# Patient Record
Sex: Female | Born: 1985 | Race: Black or African American | Hispanic: No | Smoking: Current every day smoker
Health system: Southern US, Community
[De-identification: ages and names within clinical notes are randomized; demographics above are authoritative.]

## PROBLEM LIST (undated history)

## (undated) HISTORY — PX: CHOLECYSTECTOMY: SHX55

---

## 2011-09-05 ENCOUNTER — Emergency Department: Payer: Self-pay | Admitting: Emergency Medicine

## 2011-09-05 LAB — URINALYSIS, COMPLETE
Bilirubin,UR: NEGATIVE
Glucose,UR: NEGATIVE mg/dL (ref 0–75)
Leukocyte Esterase: NEGATIVE
RBC,UR: 5 /HPF (ref 0–5)
Squamous Epithelial: 3

## 2011-09-05 LAB — COMPREHENSIVE METABOLIC PANEL
Albumin: 3.7 g/dL (ref 3.4–5.0)
Alkaline Phosphatase: 75 U/L (ref 50–136)
Anion Gap: 11 (ref 7–16)
Calcium, Total: 9.3 mg/dL (ref 8.5–10.1)
Chloride: 102 mmol/L (ref 98–107)
Co2: 25 mmol/L (ref 21–32)
Creatinine: 0.71 mg/dL (ref 0.60–1.30)
EGFR (Non-African Amer.): >60
Glucose: 104 mg/dL — ABNORMAL HIGH (ref 65–99)
Osmolality: 276 (ref 275–301)
Sodium: 138 mmol/L (ref 136–145)
Total Protein: 8.8 g/dL — ABNORMAL HIGH (ref 6.4–8.2)

## 2011-09-05 LAB — CBC
HCT: 37.8 % (ref 35.0–47.0)
HGB: 12.1 g/dL (ref 12.0–16.0)
MCHC: 32.1 g/dL (ref 32.0–36.0)
RDW: 16 % — ABNORMAL HIGH (ref 11.5–14.5)
WBC: 11.8 10*3/uL — ABNORMAL HIGH (ref 3.6–11.0)

## 2017-08-03 ENCOUNTER — Ambulatory Visit
Admission: EM | Admit: 2017-08-03 | Discharge: 2017-08-03 | Disposition: A | Discharge status: Home / Self Care | Payer: Medicaid Other | Attending: Family Medicine | Admitting: Family Medicine

## 2017-08-03 ENCOUNTER — Other Ambulatory Visit: Payer: Self-pay

## 2017-08-03 DIAGNOSIS — K0889 Other specified disorders of teeth and supporting structures: Secondary | ICD-10-CM

## 2017-08-03 DIAGNOSIS — M791 Myalgia, unspecified site: Secondary | ICD-10-CM | POA: Diagnosis not present

## 2017-08-03 DIAGNOSIS — J111 Influenza due to unidentified influenza virus with other respiratory manifestations: Secondary | ICD-10-CM

## 2017-08-03 DIAGNOSIS — J9801 Acute bronchospasm: Secondary | ICD-10-CM | POA: Diagnosis not present

## 2017-08-03 DIAGNOSIS — R0981 Nasal congestion: Secondary | ICD-10-CM

## 2017-08-03 DIAGNOSIS — R05 Cough: Secondary | ICD-10-CM | POA: Diagnosis not present

## 2017-08-03 DIAGNOSIS — R69 Illness, unspecified: Secondary | ICD-10-CM

## 2017-08-03 MED ORDER — BENZONATATE 100 MG PO CAPS: 100.0000 mg | ORAL_CAPSULE | Freq: Three times a day (TID) | ORAL | 0 refills | Status: DC | PRN

## 2017-08-03 MED ORDER — OSELTAMIVIR PHOSPHATE 75 MG PO CAPS: 75.0000 mg | ORAL_CAPSULE | Freq: Two times a day (BID) | ORAL | 0 refills | Status: AC

## 2017-08-03 MED ORDER — CLINDAMYCIN HCL 300 MG PO CAPS: 300.0000 mg | ORAL_CAPSULE | Freq: Three times a day (TID) | ORAL | 0 refills | Status: AC

## 2017-08-03 MED ORDER — PREDNISONE 10 MG PO TABS: ORAL_TABLET | ORAL | 0 refills | Status: AC

## 2017-08-03 MED ORDER — HYDROCOD POLST-CPM POLST ER 10-8 MG/5ML PO SUER: 5.0000 mL | Freq: Every evening | ORAL | 0 refills | Status: AC | PRN

## 2017-08-03 MED ORDER — ALBUTEROL SULFATE HFA 108 (90 BASE) MCG/ACT IN AERS: 2.0000 | INHALATION_SPRAY | RESPIRATORY_TRACT | 0 refills | Status: AC | PRN

## 2017-08-03 NOTE — ED Triage Notes (Addendum)
Patient complains of cough, congestion, fever, body aches x 2 days.   Patient also complains of dental pain x 1 week ago.

## 2017-08-03 NOTE — ED Provider Notes (Signed)
[facility] ____________________________________________  Time seen: Approximately 9:22 AM  I have reviewed the triage vital signs and the nursing notes.   HISTORY  Chief Complaint Cough and Dental Pain   HPI Angelica Watkins is a 32 y.o. female presenting for evaluation of 2 complaints, reporting cough congestion symptoms as well as dental pain.  Patient reports for the last 2 days she has been having runny nose, nasal congestion, scratchy throat, cough, chills, body aches and fever.  Reports fever 101 yesterday at home.  Did take BC powder today prior to arrival.  States she has been around multiple sick contacts, some that she believes has had the flu.  Also took some DayQuil yesterday without much change in symptoms.  States cough tends to come in coughing fits.  Reports overall continues to drink fluids well, slight decrease in appetite.  States scratchy throat is very mild and does not feel like previous strep.  Continues to remain active.  Denies other aggravating or alleviating factors.  Also reports that she has had left upper dental pain for just over 1 week.  Patient states she has a fractured tooth to that area from a cavity, and states that the pain has gradually been present and has been constant.  States she saw a dentist for cleaning, but has not yet scheduled appointment to have a cavity fix.  States will plan to follow-up with dentist.  Denies chest pain, shortness of breath, abdominal pain, dysuria, extremity pain, extremity swelling or rash. Denies recent sickness. Denies recent antibiotic use.  Denies history of recent immobilization.  Denies asthma history.  Patient's last menstrual period was 08/01/2017.Denies pregnancy.   History reviewed. No pertinent past medical history.  There are no active problems to display for this patient.   Past Surgical History:  Procedure Laterality Date  . CHOLECYSTECTOMY       No current facility-administered  medications for this encounter.   Current Outpatient Medications:  .  albuterol (PROVENTIL HFA;VENTOLIN HFA) 108 (90 Base) MCG/ACT inhaler, Inhale 2 puffs into the lungs every 4 (four) hours as needed for wheezing or shortness of breath., Disp: 1 Inhaler, Rfl: 0 .  benzonatate (TESSALON PERLES) 100 MG capsule, Take 1 capsule (100 mg total) by mouth 3 (three) times daily as needed for cough., Disp: 15 capsule, Rfl: 0 .  chlorpheniramine-HYDROcodone (TUSSIONEX PENNKINETIC ER) 10-8 MG/5ML SUER, Take 5 mLs by mouth at bedtime as needed for cough. do not drive or operate machinery while taking as can cause drowsiness., Disp: 75 mL, Rfl: 0 .  clindamycin (CLEOCIN) 300 MG capsule, Take 1 capsule (300 mg total) by mouth 3 (three) times daily for 7 days., Disp: 21 capsule, Rfl: 0 .  oseltamivir (TAMIFLU) 75 MG capsule, Take 1 capsule (75 mg total) by mouth every 12 (twelve) hours., Disp: 10 capsule, Rfl: 0 .  predniSONE (DELTASONE) 10 MG tablet, Start 60 mg po day one, then 50 mg po day two, taper by 10 mg daily until complete., Disp: 21 tablet, Rfl: 0  Allergies Penicillins  Family History  Problem Relation Age of Onset  . Thyroid disease Mother   . Hypertension Mother   . Hypertension Father   . Diabetes Father   . Kidney failure Father     Social History Social History   Tobacco Use  . Smoking status: Current Every Day Smoker    Packs/day: 0.20  . Smokeless tobacco: Never Used  Substance Use Topics  . Alcohol use: No    Frequency: Never  .  Drug use: No    Review of Systems Constitutional: As above.  ENT: As above.  Cardiovascular: Denies chest pain. Respiratory: Denies shortness of breath. Gastrointestinal: No abdominal pain.  No nausea, no vomiting.  No diarrhea.  Genitourinary: Negative for dysuria. Musculoskeletal: Negative for back pain. Skin: Negative for rash.   ____________________________________________   PHYSICAL EXAM:  VITAL SIGNS: ED Triage Vitals  Enc  Vitals Group     BP 08/03/17 0859 116/70     Pulse Rate 08/03/17 0859 (!) 110     Resp 08/03/17 0859 18     Temp 08/03/17 0859 100.3 F (37.9 C)     Temp Source 08/03/17 0859 Oral     SpO2 08/03/17 0859 95 %     Weight 08/03/17 0855 280 lb (127 kg)     Height 08/03/17 0855 5\' 5"  (1.651 m)     Head Circumference --      Peak Flow --      Pain Score 08/03/17 0855 8     Pain Loc --      Pain Edu? --      Excl. in GC? --     Constitutional: Alert and oriented. Well appearing and in no acute distress. Eyes: Conjunctivae are normal.  Head: Atraumatic. No sinus tenderness to palpation. No swelling. No erythema.  Ears: no erythema, normal TMs bilaterally.   Nose:Nasal congestion with clear rhinorrhea  Mouth/Throat: Mucous membranes are moist. No pharyngeal erythema. No tonsillar swelling or exudate.  Left upper last molar, lateral aspect cavity and fracture present with mild surrounding gum tenderness, erythema and inflammation, no visualized or palpated abscess. Neck: No stridor.  No cervical spine tenderness to palpation. Hematological/Lymphatic/Immunilogical: No cervical lymphadenopathy. Cardiovascular: Normal rate, regular rhythm. Grossly normal heart sounds.  Good peripheral circulation. Respiratory: Normal respiratory effort.  No retractions. No wheezes, rales or rhonchi. Good air movement.  Dry intermittent cough noted in room with mild bronchospasm.  Speaks in complete sentences. Gastrointestinal: Soft and nontender. Obese abdomen.  Musculoskeletal: Ambulatory with steady gait.  Neurologic:  Normal speech and language. No gait instability. Skin:  Skin appears warm, dry and intact. No rash noted. Psychiatric: Mood and affect are normal. Speech and behavior are normal.  ___________________________________________   LABS (all labs ordered are listed, but only abnormal results are displayed)  Labs Reviewed - No data to display  RADIOLOGY  No results  found. ____________________________________________   PROCEDURES Procedures  _________________________________________   INITIAL IMPRESSION / ASSESSMENT AND PLAN / ED COURSE  Pertinent labs & imaging results that were available during my care of the patient were reviewed by me and considered in my medical decision making (see chart for details).  Well-appearing patient.  No acute distress.  Patient has dental pain with erythema at cavity and fracture site, concern for secondary infection.  Patient penicillin allergic, will place patient on oral clindamycin.  Encourage over-the-counter probiotics and supportive care.  Patient also with influenza-like illness.  Patient lungs clear throughout, however noted bronchospasm with cough.  Symptoms present for 2 days and lungs clear throughout, deferred x-ray, patient agreed.  Will place patient on prednisone taper, albuterol inhaler, Tussionex, Tessalon and Tamiflu.  Discussed very strict follow-up and return parameters.  Encourage rest, fluids, supportive care.  Also encourage patient to follow back up with her dentist.Discussed indication, risks and benefits of medications with patient.  Discussed follow up with Primary care physician this week. Discussed follow up and return parameters including no resolution or any worsening concerns. Patient verbalized  understanding and agreed to plan.   ____________________________________________   FINAL CLINICAL IMPRESSION(S) / ED DIAGNOSES  Final diagnoses:  Influenza-like illness  Bronchospasm  Pain, dental     ED Discharge Orders        Ordered    predniSONE (DELTASONE) 10 MG tablet     08/03/17 0937    benzonatate (TESSALON PERLES) 100 MG capsule  3 times daily PRN     08/03/17 0937    chlorpheniramine-HYDROcodone (TUSSIONEX PENNKINETIC ER) 10-8 MG/5ML SUER  At bedtime PRN     08/03/17 0937    albuterol (PROVENTIL HFA;VENTOLIN HFA) 108 (90 Base) MCG/ACT inhaler  Every 4 hours PRN      08/03/17 0937    oseltamivir (TAMIFLU) 75 MG capsule  Every 12 hours     08/03/17 0937    clindamycin (CLEOCIN) 300 MG capsule  3 times daily     08/03/17 [id number]       Note: This dictation was prepared with Dragon dictation along with smaller phrase technology. Any transcriptional errors that result from this process are unintentional.         Renford DillsMiller, Merle Whitehorn, NP 08/03/17 [phone]

## 2017-08-03 NOTE — Discharge Instructions (Signed)
Take medication as prescribed. Rest. Drink plenty of fluids.   Follow up with Dentist as soon as possible.  Follow up with your primary care physician this week as needed. Return to Urgent care for new or worsening concerns.

## 2017-08-06 ENCOUNTER — Emergency Department: Payer: Medicaid Other

## 2017-08-06 ENCOUNTER — Telehealth: Payer: Self-pay

## 2017-08-06 ENCOUNTER — Other Ambulatory Visit: Payer: Self-pay

## 2017-08-06 ENCOUNTER — Emergency Department
Admission: EM | Admit: 2017-08-06 | Discharge: 2017-08-06 | Disposition: A | Discharge status: Home / Self Care | Payer: Medicaid Other | Attending: Emergency Medicine | Admitting: Emergency Medicine

## 2017-08-06 ENCOUNTER — Encounter: Payer: Self-pay | Admitting: Emergency Medicine

## 2017-08-06 DIAGNOSIS — Z79899 Other long term (current) drug therapy: Secondary | ICD-10-CM | POA: Insufficient documentation

## 2017-08-06 DIAGNOSIS — F17210 Nicotine dependence, cigarettes, uncomplicated: Secondary | ICD-10-CM | POA: Diagnosis not present

## 2017-08-06 DIAGNOSIS — J111 Influenza due to unidentified influenza virus with other respiratory manifestations: Secondary | ICD-10-CM | POA: Diagnosis not present

## 2017-08-06 DIAGNOSIS — R05 Cough: Secondary | ICD-10-CM | POA: Diagnosis present

## 2017-08-06 DIAGNOSIS — R69 Illness, unspecified: Secondary | ICD-10-CM

## 2017-08-06 MED ORDER — BENZONATATE 100 MG PO CAPS: 100.0000 mg | ORAL_CAPSULE | Freq: Three times a day (TID) | ORAL | 0 refills | Status: AC | PRN

## 2017-08-06 MED ORDER — IPRATROPIUM-ALBUTEROL 0.5-2.5 (3) MG/3ML IN SOLN
3.0000 mL | Freq: Once | RESPIRATORY_TRACT | Status: AC
  Administered 2017-08-06: 3 mL via RESPIRATORY_TRACT
  Filled 2017-08-06: qty 3

## 2017-08-06 NOTE — ED Provider Notes (Signed)
[facility] Emergency Department Provider Note  ____________________________________________  Time seen: Approximately 7:00 PM  I have reviewed the triage vital signs and the nursing notes.   HISTORY  Chief Complaint Cough    HPI Angelica GeneralCarolyn Watkins is a 32 y.o. female that presents emergency department for evaluation of subjective fever, body aches, nasal congestion and productive cough with clear sputum for 3 days.  She was diagnosed with influenza 3 days ago.  She started to feel better over the weekend but then felt worse again.  Patient states that she had a fever of 103 earlier today but took ibuprofen.  She has been taking Tamiflu, clindamycin (for dental pain), prednisone, and Tussinex.  She smokes 1-2 cigarettes/day.  No sore throat, nausea, vomiting, abdominal pain.   History reviewed. No pertinent past medical history.  There are no active problems to display for this patient.   Past Surgical History:  Procedure Laterality Date  . CHOLECYSTECTOMY      Prior to Admission medications   Medication Sig Start Date End Date Taking? Authorizing Provider  albuterol (PROVENTIL HFA;VENTOLIN HFA) 108 (90 Base) MCG/ACT inhaler Inhale 2 puffs into the lungs every 4 (four) hours as needed for wheezing or shortness of breath. 08/03/17   Renford DillsMiller, Lindsey, NP  benzonatate (TESSALON PERLES) 100 MG capsule Take 1 capsule (100 mg total) by mouth 3 (three) times daily as needed for cough. 08/06/17 08/06/18  Enid DerryWagner, Curby Carswell, PA-C  chlorpheniramine-HYDROcodone (TUSSIONEX PENNKINETIC ER) 10-8 MG/5ML SUER Take 5 mLs by mouth at bedtime as needed for cough. do not drive or operate machinery while taking as can cause drowsiness. 08/03/17   Renford DillsMiller, Lindsey, NP  clindamycin (CLEOCIN) 300 MG capsule Take 1 capsule (300 mg total) by mouth 3 (three) times daily for 7 days. 08/03/17 08/10/17  Renford DillsMiller, Lindsey, NP  oseltamivir (TAMIFLU) 75 MG capsule Take 1 capsule (75 mg total) by mouth every 12  (twelve) hours. 08/03/17   Renford DillsMiller, Lindsey, NP  predniSONE (DELTASONE) 10 MG tablet Start 60 mg po day one, then 50 mg po day two, taper by 10 mg daily until complete. 08/03/17   Renford DillsMiller, Lindsey, NP    Allergies Penicillins  Family History  Problem Relation Age of Onset  . Thyroid disease Mother   . Hypertension Mother   . Hypertension Father   . Diabetes Father   . Kidney failure Father     Social History Social History   Tobacco Use  . Smoking status: Current Every Day Smoker    Packs/day: 0.20  . Smokeless tobacco: Never Used  Substance Use Topics  . Alcohol use: No    Frequency: Never  . Drug use: No     Review of Systems  Constitutional: Positive for fever Eyes: No visual changes. No discharge. ENT: Positive for congestion and rhinorrhea. Cardiovascular: No chest pain. Respiratory: Positive for cough. No SOB. Gastrointestinal: No abdominal pain.  No nausea, no vomiting.  No diarrhea.  No constipation. Skin: Negative for rash, abrasions, lacerations, ecchymosis.   ____________________________________________   PHYSICAL EXAM:  VITAL SIGNS: ED Triage Vitals  Enc Vitals Group     BP 08/06/17 1821 121/81     Pulse Rate 08/06/17 1821 90     Resp 08/06/17 1821 18     Temp 08/06/17 1821 98.3 F (36.8 C)     Temp Source 08/06/17 1821 Oral     SpO2 08/06/17 1821 95 %     Weight 08/06/17 1823 280 lb (127 kg)     Height  08/06/17 1823 5\' 5"  (1.651 m)     Head Circumference --      Peak Flow --      Pain Score 08/06/17 1823 8     Pain Loc --      Pain Edu? --      Excl. in GC? --      Constitutional: Alert and oriented. Well appearing and in no acute distress. Eyes: Conjunctivae are normal. PERRL. EOMI. No discharge. Head: Atraumatic. ENT: No frontal and maxillary sinus tenderness.      Ears: Tympanic membranes pearly gray with good landmarks. No discharge.      Nose: Mild congestion/rhinnorhea.      Mouth/Throat: Mucous membranes are moist. Oropharynx  non-erythematous. Tonsils not enlarged. No exudates. Uvula midline. Neck: No stridor.   Hematological/Lymphatic/Immunilogical: No cervical lymphadenopathy. Cardiovascular: Normal rate, regular rhythm.  Good peripheral circulation. Respiratory: Normal respiratory effort without tachypnea or retractions. Lungs CTAB. Good air entry to the bases with no decreased or absent breath sounds. Gastrointestinal: Bowel sounds 4 quadrants. Soft and nontender to palpation. No guarding or rigidity. No palpable masses. No distention. Musculoskeletal: Full range of motion to all extremities. No gross deformities appreciated. Neurologic:  Normal speech and language. No gross focal neurologic deficits are appreciated.  Skin:  Skin is warm, dry and intact. No rash noted.    ____________________________________________   LABS (all labs ordered are listed, but only abnormal results are displayed)  Labs Reviewed - No data to display ____________________________________________  EKG   ____________________________________________  RADIOLOGY Lexine Baton, personally viewed and evaluated these images (plain radiographs) as part of my medical decision making, as well as reviewing the written report by the radiologist.  Dg Chest 2 View  Result Date: 08/06/2017 CLINICAL DATA:  Cough x2 days. EXAM: CHEST  2 VIEW COMPARISON:  None. FINDINGS: The heart size and mediastinal contours are within normal limits. Mild prominence of interstitial lung markings may reflect bronchitic change. No pneumonic consolidations. The visualized skeletal structures are unremarkable. IMPRESSION: Mild bronchitic change of the lungs possibly smoking related. Otherwise negative. Electronically Signed   By: Tollie Eth M.D.   On: 08/06/2017 19:41    ____________________________________________    PROCEDURES  Procedure(s) performed:    Procedures    Medications  ipratropium-albuterol (DUONEB) 0.5-2.5 (3) MG/3ML nebulizer  solution 3 mL (3 mLs Nebulization Given 08/06/17 1906)     ____________________________________________   INITIAL IMPRESSION / ASSESSMENT AND PLAN / ED COURSE  Pertinent labs & imaging results that were available during my care of the patient were reviewed by me and considered in my medical decision making (see chart for details).  Review of the [place] CSRS was performed in accordance of the [facility] prior to dispensing any controlled drugs.     Patient's diagnosis is consistent with influenza like illness.  Vital signs and exam are reassuring.  Chest x-ray negative for acute cardiopulmonary processes.  Patient is already taking Tamiflu, clindamycin, Tussionex, prednisone.  Patient appears well and is staying well hydrated. Patient feels comfortable going home. Patient will be discharged home with prescriptions for [facility]. Patient is to follow up with PCP as needed or otherwise directed. Patient is given ED precautions to return to the ED for any worsening or new symptoms.  ____________________________________________  FINAL CLINICAL IMPRESSION(S) / ED DIAGNOSES  Final diagnoses:  Influenza-like illness      NEW MEDICATIONS STARTED DURING THIS VISIT:  ED Discharge Orders        Ordered    benzonatate (  TESSALON PERLES) 100 MG capsule  3 times daily PRN     08/06/17 2014          This chart was dictated using voice recognition software/Dragon. Despite best efforts to proofread, errors can occur which can change the meaning. Any change was purely unintentional.    Enid Derry, PA-C 08/06/17 2100    Dionne Bucy, MD 08/06/17 2211

## 2017-08-06 NOTE — ED Triage Notes (Signed)
Cough x 2 days. Seen urgent care and told she had the flu. Put on tamaflu. States symptoms have not resolved.

## 2017-08-06 NOTE — Telephone Encounter (Signed)
Called to follow up with patient [facility]. Patient number will not dial. Patient will call back with any questions or concerns. [facility]

## 2017-08-06 NOTE — ED Notes (Signed)
See triage note  States she was dx'd with the flu last week states she felt better and went out over the weekend  Now she has a prod cough  Afebrile on arrival but states she did have fever at home of 103

## 2018-12-10 IMAGING — CR DG CHEST 2V
1 series · 2 of 2 positions shown · non-contrast
Comparison: None.

CLINICAL DATA: Cough x2 days.

EXAM:
CHEST  2 VIEW

[Series 1: dg chest 2 view · 0.14mm/px · 2 of 2 slices shown]
[im 1/2]
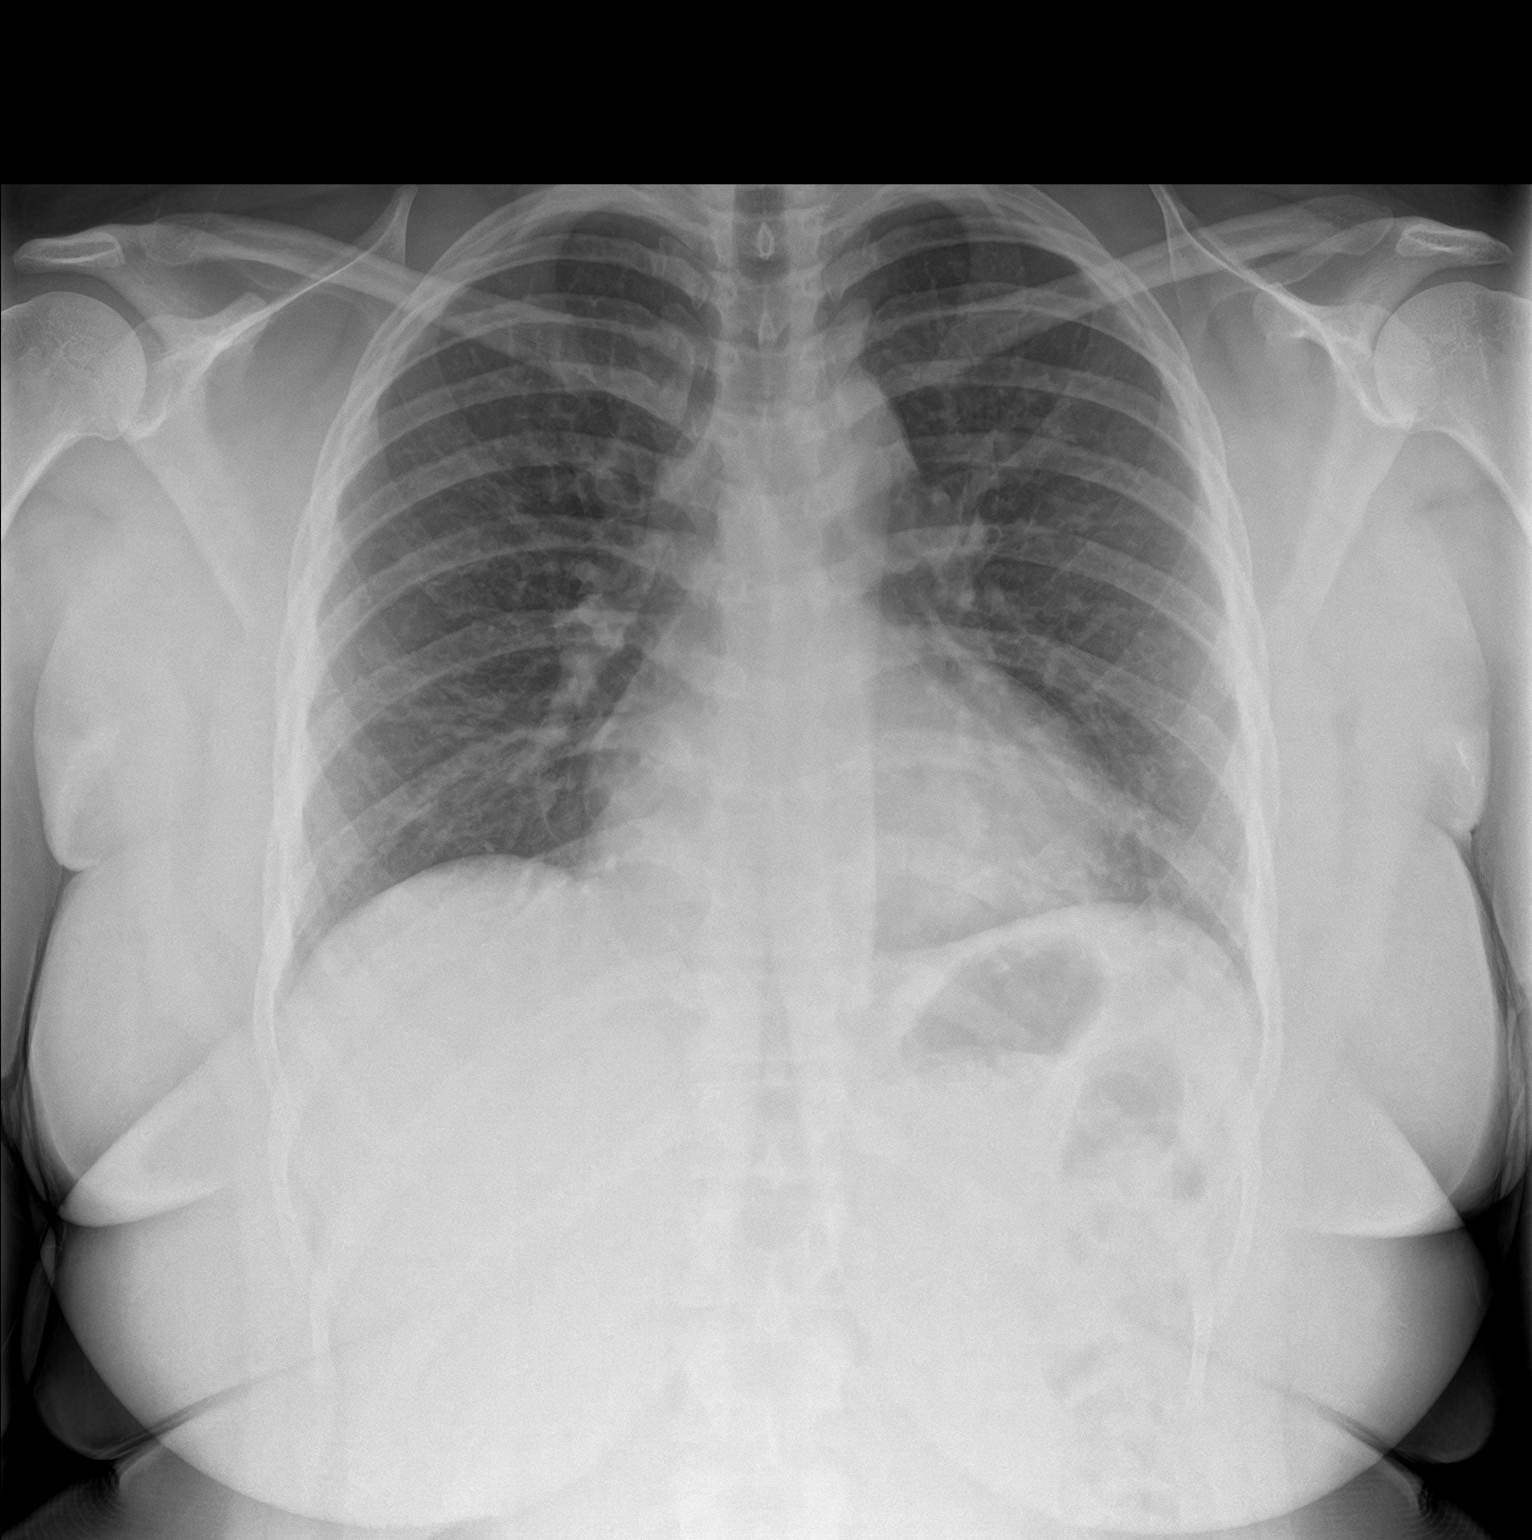
[im 2/2]
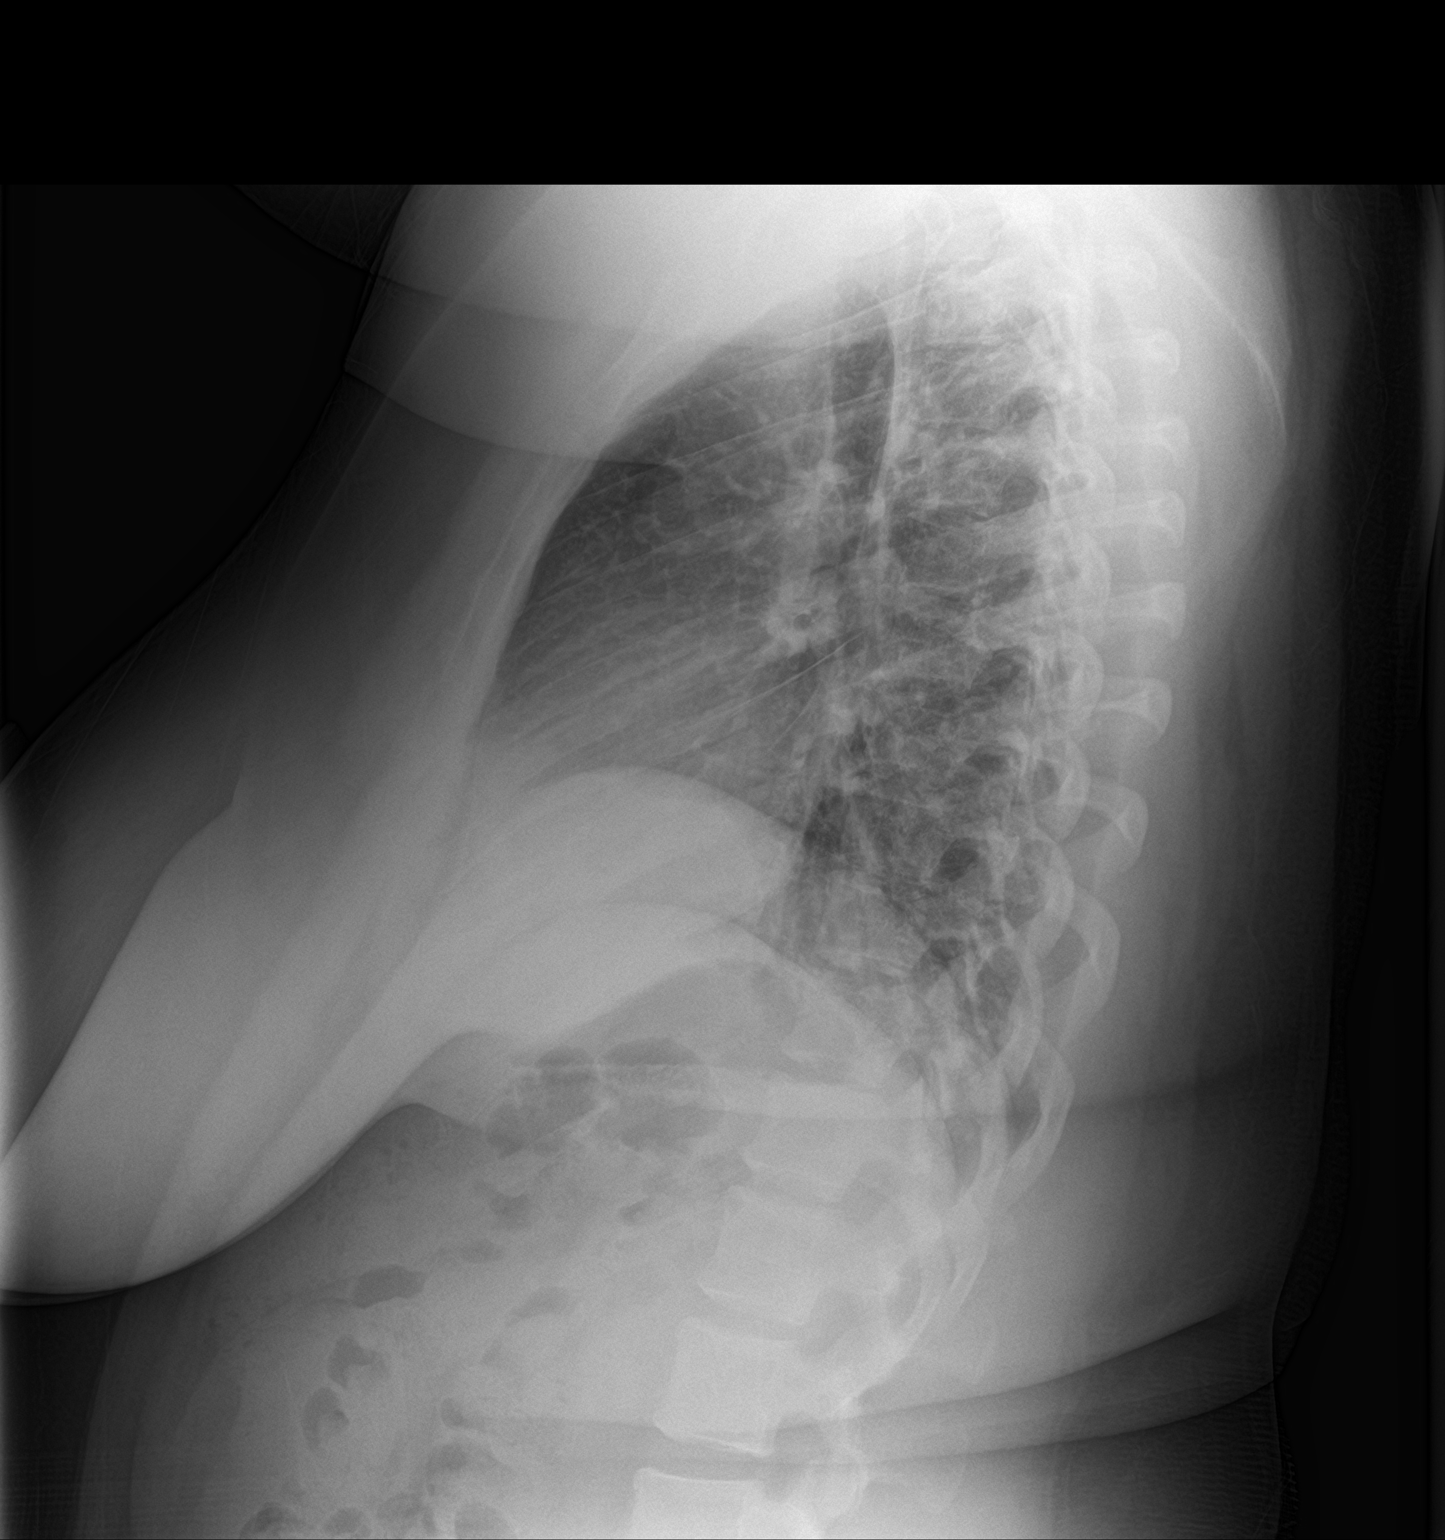

[2 of 2 positions shown; findings below may reference images not displayed]

FINDINGS: The heart size and mediastinal contours are within normal limits.
Mild prominence of interstitial lung markings may reflect bronchitic
change. No pneumonic consolidations. The visualized skeletal
structures are unremarkable.
IMPRESSION: Mild bronchitic change of the lungs possibly smoking related.
Otherwise negative.
